# Patient Record
Sex: Female | Born: 1990 | Race: Black or African American | Hispanic: No | Marital: Single | Smoking: Never smoker
Health system: Southern US, Community
[De-identification: ages and names within clinical notes are randomized; demographics above are authoritative.]

---
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
## 2009-01-01 ENCOUNTER — Encounter: Payer: Self-pay | Admitting: *Deleted

## 2012-09-21 ENCOUNTER — Emergency Department (HOSPITAL_COMMUNITY)
Admission: EM | Admit: 2012-09-21 | Discharge: 2012-09-21 | Disposition: A | Discharge status: Home / Self Care | Payer: BC Managed Care – PPO | Attending: Emergency Medicine | Admitting: Emergency Medicine

## 2012-09-21 ENCOUNTER — Encounter (HOSPITAL_COMMUNITY): Payer: Self-pay | Admitting: Emergency Medicine

## 2012-09-21 DIAGNOSIS — M542 Cervicalgia: Secondary | ICD-10-CM | POA: Insufficient documentation

## 2012-09-21 DIAGNOSIS — M2420 Disorder of ligament, unspecified site: Secondary | ICD-10-CM | POA: Insufficient documentation

## 2012-09-21 DIAGNOSIS — IMO0002 Reserved for concepts with insufficient information to code with codable children: Secondary | ICD-10-CM | POA: Insufficient documentation

## 2012-09-21 DIAGNOSIS — M629 Disorder of muscle, unspecified: Secondary | ICD-10-CM | POA: Insufficient documentation

## 2012-09-21 DIAGNOSIS — M62838 Other muscle spasm: Secondary | ICD-10-CM | POA: Insufficient documentation

## 2012-09-21 DIAGNOSIS — Z79899 Other long term (current) drug therapy: Secondary | ICD-10-CM | POA: Insufficient documentation

## 2012-09-21 DIAGNOSIS — R252 Cramp and spasm: Secondary | ICD-10-CM

## 2012-09-21 MED ORDER — METHOCARBAMOL 500 MG PO TABS: 500.0000 mg | ORAL_TABLET | Freq: Two times a day (BID) | ORAL | Status: DC

## 2012-09-21 MED ORDER — IBUPROFEN 800 MG PO TABS: 800.0000 mg | ORAL_TABLET | Freq: Three times a day (TID) | ORAL | Status: DC

## 2012-09-21 MED ORDER — KETOROLAC TROMETHAMINE 60 MG/2ML IM SOLN
60.0000 mg | Freq: Once | INTRAMUSCULAR | Status: AC
  Administered 2012-09-21: 60 mg via INTRAMUSCULAR
  Filled 2012-09-21: qty 2

## 2012-09-21 NOTE — ED Provider Notes (Signed)
CSN: [id number]     Arrival date & time 09/21/12  2221 History     First MD Initiated Contact with Patient 09/21/12 2240     Chief Complaint  Patient presents with  . Headache  . Neck Pain   (Consider location/radiation/quality/duration/timing/severity/associated sxs/prior Treatment) HPI Comments: Patient presents emergency department with chief complaint of neck pain. She states that she works as a Production assistant, radio at [employer], and thinks that she may have strained her neck while at work. She states that the left side of her neck feels tight. She reports associated headache. She denies fevers, chills, nausea, or vomiting. She states that she has been taking Goody powders and using heat on the affected area with some relief. She denies any other mechanism of injury. She is otherwise healthy. No other complaints  The history is provided by the patient. No language interpreter was used.    History reviewed. No pertinent past medical history. History reviewed. No pertinent past surgical history. No family history on file. History  Substance Use Topics  . Smoking status: Never Smoker   . Smokeless tobacco: Never Used  . Alcohol Use: Yes     Comment: social   OB History   Grav Para Term Preterm Abortions TAB SAB Ect Mult Living                 Review of Systems  All other systems reviewed and are negative.    Allergies  Review of patient's allergies indicates no known allergies.  Home Medications   Current Outpatient Rx  Name  Route  Sig  Dispense  Refill  . Aspirin-Acetaminophen-Caffeine (GOODY HEADACHE PO)   Oral   Take 1 packet by mouth daily as needed (HA pain).         . fexofenadine (ALLEGRA) 180 MG tablet      1 pill once a day as needed for allergies          . fluticasone (FLONASE) 50 MCG/ACT nasal spray   Nasal   2 sprays by Nasal route daily.           . norgestimate-ethinyl estradiol (SPRINTEC 28) 0.25-35 MG-MCG per tablet      1 pill by mouth daily as  directed. Dispense quantity sufficient for 1 month          . ibuprofen (ADVIL,MOTRIN) 800 MG tablet   Oral   Take 1 tablet (800 mg total) by mouth 3 (three) times daily.   21 tablet   0   . methocarbamol (ROBAXIN) 500 MG tablet   Oral   Take 1 tablet (500 mg total) by mouth 2 (two) times daily.   20 tablet   0    BP 142/84  Pulse 105  Temp(Src) 98.7 F (37.1 C) (Oral)  Resp 16  Ht 5\' 7"  (1.702 m)  Wt 193 lb (87.544 kg)  BMI 30.22 kg/m2  SpO2 95%  LMP 09/12/2012 Physical Exam  Nursing note and vitals reviewed. Constitutional: She is oriented to person, place, and time. She appears well-developed and well-nourished. No distress.  HENT:  Head: Normocephalic and atraumatic.  Eyes: Conjunctivae and EOM are normal. Right eye exhibits no discharge. Left eye exhibits no discharge. No scleral icterus.  Neck: Normal range of motion. Neck supple. No tracheal deviation present.  No meningeal signs  Cardiovascular: Normal rate, regular rhythm and normal heart sounds.  Exam reveals no gallop and no friction rub.   No murmur heard. Pulmonary/Chest: Effort normal and breath sounds normal.  No respiratory distress. She has no wheezes.  Abdominal: Soft. She exhibits no distension. There is no tenderness.  Musculoskeletal: Normal range of motion.  Left sided cervical paraspinal and upper trapezius muscles tender to palpation, no bony tenderness, step-offs, or gross abnormality or deformity of spine, patient is able to ambulate, moves all extremities  Neurological: She is alert and oriented to person, place, and time.  Sensation and strength intact bilaterally  Skin: Skin is warm. She is not diaphoretic.  Psychiatric: She has a normal mood and affect. Her behavior is normal. Judgment and thought content normal.    ED Course   Procedures (including critical care time)  Labs Reviewed - No data to display No results found. 1. Muscle cramp     MDM  Patient with muscle cramps of the  upper trapezius on left side. Likely causing tension headache. Will give portal shot, and will discharge to home with ibuprofen and instructions to use ice on the affected area. Patient denies fevers, she is not having meningeal signs. She is well-appearing and is stable and ready for discharge.  Roxy Horseman, PA-C 09/21/12 2356

## 2012-09-21 NOTE — ED Notes (Signed)
Per pt, she was having a bad HA Monday night. On Tuesday, the pain radiated down to her neck and to her left shoulder. Pt states she has been taking goody powder and applying heat to the area. 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

## 2012-09-22 NOTE — ED Provider Notes (Signed)
Medical screening examination/treatment/procedure(s) were performed by non-physician practitioner and as supervising physician I was immediately available for consultation/collaboration.  Flint Melter, MD 09/22/12 [phone]

## 2013-01-04 ENCOUNTER — Emergency Department (HOSPITAL_COMMUNITY): Payer: BC Managed Care – PPO

## 2013-01-04 ENCOUNTER — Emergency Department (HOSPITAL_COMMUNITY)
Admission: EM | Admit: 2013-01-04 | Discharge: 2013-01-05 | Disposition: A | Discharge status: Home / Self Care | Payer: BC Managed Care – PPO | Attending: Emergency Medicine | Admitting: Emergency Medicine

## 2013-01-04 DIAGNOSIS — S161XXA Strain of muscle, fascia and tendon at neck level, initial encounter: Secondary | ICD-10-CM

## 2013-01-04 DIAGNOSIS — IMO0002 Reserved for concepts with insufficient information to code with codable children: Secondary | ICD-10-CM | POA: Insufficient documentation

## 2013-01-04 DIAGNOSIS — Y9389 Activity, other specified: Secondary | ICD-10-CM | POA: Insufficient documentation

## 2013-01-04 DIAGNOSIS — Z79899 Other long term (current) drug therapy: Secondary | ICD-10-CM | POA: Insufficient documentation

## 2013-01-04 DIAGNOSIS — Z791 Long term (current) use of non-steroidal anti-inflammatories (NSAID): Secondary | ICD-10-CM | POA: Insufficient documentation

## 2013-01-04 DIAGNOSIS — S4980XA Other specified injuries of shoulder and upper arm, unspecified arm, initial encounter: Secondary | ICD-10-CM | POA: Insufficient documentation

## 2013-01-04 DIAGNOSIS — S46909A Unspecified injury of unspecified muscle, fascia and tendon at shoulder and upper arm level, unspecified arm, initial encounter: Secondary | ICD-10-CM | POA: Insufficient documentation

## 2013-01-04 DIAGNOSIS — Y92410 Unspecified street and highway as the place of occurrence of the external cause: Secondary | ICD-10-CM | POA: Insufficient documentation

## 2013-01-04 DIAGNOSIS — S139XXA Sprain of joints and ligaments of unspecified parts of neck, initial encounter: Secondary | ICD-10-CM | POA: Insufficient documentation

## 2013-01-04 MED ORDER — CYCLOBENZAPRINE HCL 10 MG PO TABS
10.0000 mg | ORAL_TABLET | Freq: Once | ORAL | Status: AC
  Administered 2013-01-04: 10 mg via ORAL
  Filled 2013-01-04: qty 1

## 2013-01-04 MED ORDER — IBUPROFEN 800 MG PO TABS
800.0000 mg | ORAL_TABLET | Freq: Once | ORAL | Status: AC
  Administered 2013-01-04: 800 mg via ORAL
  Filled 2013-01-04: qty 1
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
## 2013-01-05 MED ORDER — IBUPROFEN 800 MG PO TABS: 800.0000 mg | ORAL_TABLET | Freq: Four times a day (QID) | ORAL | Status: DC | PRN

## 2013-01-05 MED ORDER — CYCLOBENZAPRINE HCL 10 MG PO TABS: 10.0000 mg | ORAL_TABLET | Freq: Three times a day (TID) | ORAL | Status: DC | PRN

## 2013-01-05 NOTE — ED Provider Notes (Signed)
Medical screening examination/treatment/procedure(s) were performed by non-physician practitioner and as supervising physician I was immediately available for consultation/collaboration.  EKG Interpretation   None        Tynika Luddy, MD 01/05/13 0438 

## 2015-03-22 ENCOUNTER — Encounter (HOSPITAL_COMMUNITY): Payer: Self-pay | Admitting: *Deleted

## 2015-03-22 ENCOUNTER — Emergency Department (INDEPENDENT_AMBULATORY_CARE_PROVIDER_SITE_OTHER): Payer: Self-pay

## 2015-03-22 ENCOUNTER — Emergency Department (INDEPENDENT_AMBULATORY_CARE_PROVIDER_SITE_OTHER)
Admission: EM | Admit: 2015-03-22 | Discharge: 2015-03-22 | Disposition: A | Discharge status: Home / Self Care | Payer: Self-pay | Source: Home / Self Care | Attending: Family Medicine | Admitting: Family Medicine

## 2015-03-22 DIAGNOSIS — S161XXA Strain of muscle, fascia and tendon at neck level, initial encounter: Secondary | ICD-10-CM

## 2015-03-22 MED ORDER — DICLOFENAC POTASSIUM 50 MG PO TABS
50.0000 mg | ORAL_TABLET | Freq: Three times a day (TID) | ORAL | Status: AC
Start: 2015-03-22 — End: ?

## 2015-03-22 MED ORDER — CYCLOBENZAPRINE HCL 5 MG PO TABS: 5.0000 mg | ORAL_TABLET | Freq: Three times a day (TID) | ORAL | Status: AC | PRN

## 2015-03-22 NOTE — ED Notes (Signed)
Pt  Reports    Headache   With     Pain   In  Neck   And  l  Shoulder       With   Symptoms  X  2  Weeks           Pt  Reports   Pain not  releived  By OTC     MEDS        Pt   Reports     History  Of  Allergies         Seasonal /  Pollen           Pt  Also  Reports  Some  History     Of          Headaches   In  Past

## 2015-03-22 NOTE — ED Provider Notes (Signed)
CSN: [id number]     Arrival date & time 03/22/15  1313 History   First MD Initiated Contact with Patient 03/22/15 1450     Chief Complaint  Patient presents with  . Headache   (Consider location/radiation/quality/duration/timing/severity/associated sxs/prior Treatment) Patient is a 25 y.o. female presenting with headaches. The history is provided by the patient.  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 Relieved by:  None tried Worsened by:  Nothing Associated symptoms: neck pain   Associated symptoms: no congestion, no ear pain, no neck stiffness and no sore throat     History reviewed. No pertinent past medical history. History reviewed. No pertinent past surgical history. History reviewed. No pertinent family history. Social History  Substance Use Topics  . Smoking status: Never Smoker   . Smokeless tobacco: Never Used  . Alcohol Use: Yes     Comment: social   OB History    No data available     Review of Systems  Constitutional: Negative.   HENT: Negative for congestion, ear pain, rhinorrhea and sore throat.   Musculoskeletal: Positive for neck pain. Negative for neck stiffness.  Skin: Negative.   Neurological: Positive for headaches.  Hematological: Negative for adenopathy.  All other systems reviewed and are negative.   Allergies  Review of patient's allergies indicates no known allergies.  Home Medications   Prior to Admission medications   Medication Sig Start Date End Date Taking? Authorizing Provider  cyclobenzaprine (FLEXERIL) 5 MG tablet Take 1 tablet (5 mg total) by mouth 3 (three) times daily as needed for muscle spasms. 03/22/15   Linna Hoff, MD  diclofenac (CATAFLAM) 50 MG tablet Take 1 tablet (50 mg total) by mouth 3 (three)  times daily. 03/22/15   Linna Hoff, MD  ibuprofen (ADVIL,MOTRIN) 800 MG tablet Take 1 tablet (800 mg total) by mouth every 6 (six) hours as needed for moderate pain. 01/05/13   Cherrie Distance, PA-C   Meds Ordered and Administered this Visit  Medications - No data to display  BP 126/82 mmHg  Pulse 83  Temp(Src) 98.9 F (37.2 C) (Oral)  SpO2 98%  LMP 02/18/2015 No data found.   Physical Exam  Constitutional: She is oriented to person, place, and time. She appears well-developed and well-nourished. No distress.  HENT:  Right Ear: External ear normal.  Left Ear: External ear normal.  Mouth/Throat: Oropharynx is clear and moist.  Eyes: Pupils are equal, round, and reactive to light.  Neck: Normal range of motion. Neck supple.  Musculoskeletal: Normal range of motion. She exhibits tenderness.       Back:  Lymphadenopathy:    She has no cervical adenopathy.  Neurological: She is alert and oriented to person, place, and time.  Skin: Skin is warm and dry.  Nursing note and vitals reviewed.   ED Course  Procedures (including critical care time)  Labs Review Labs Reviewed - No data to display  Imaging Review Dg Cervical Spine Complete  03/22/2015  CLINICAL DATA:  Headache and neck pain for 2 weeks. No known injury. Initial encounter. EXAM: CERVICAL SPINE - COMPLETE 4+ VIEW COMPARISON:  Plain film cervical spine 01/04/2013. FINDINGS: There is no evidence of cervical spine fracture or prevertebral soft tissue swelling. Alignment is normal. No other significant  bone abnormalities are identified. IMPRESSION: Negative cervical spine radiographs. Electronically Signed   By: Drusilla Kanner M.D.   On: 03/22/2015 15:26   X-rays reviewed and report per radiologist.   Visual Acuity Review  Right Eye Distance:   Left Eye Distance:   Bilateral Distance:    Right Eye Near:   Left Eye Near:    Bilateral Near:         MDM   1. Cervical myofascial strain, initial encounter         Linna Hoff, MD 03/22/15 [phone]

## 2017-02-15 IMAGING — DX DG CERVICAL SPINE COMPLETE 4+V
6 series · 6 of 6 positions shown · non-contrast
Comparison: Plain film cervical spine 01/04/2013.

CLINICAL DATA: Headache and neck pain for 2 weeks. No known injury.
Initial encounter.

EXAM:
CERVICAL SPINE - COMPLETE 4+ VIEW

[c-spine lat]
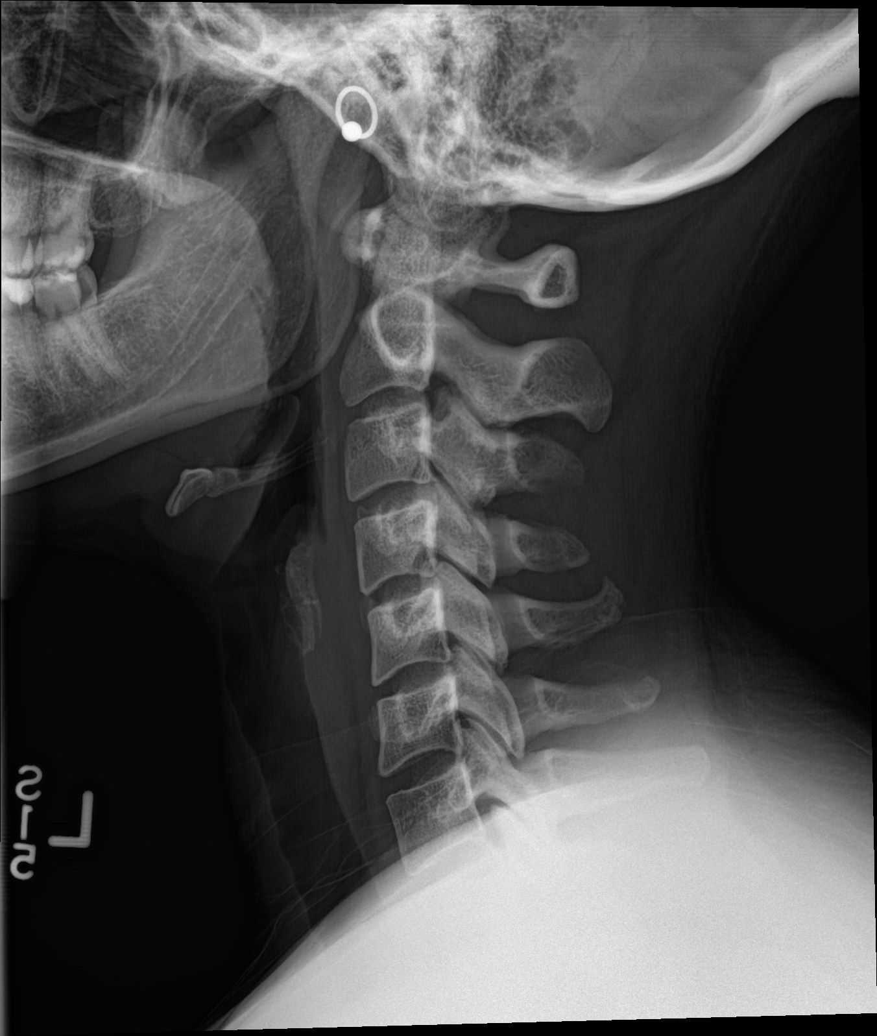

[c-spine obl (1 of 2)]
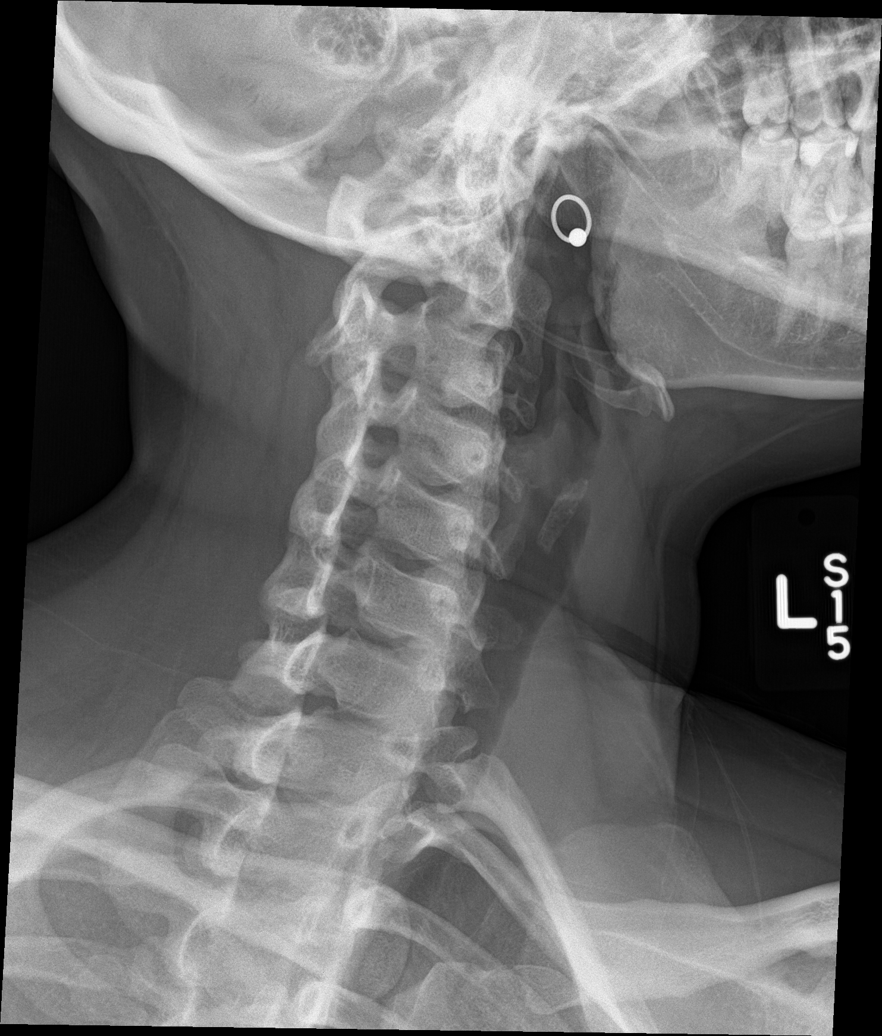

[c-spine obl (2 of 2)]
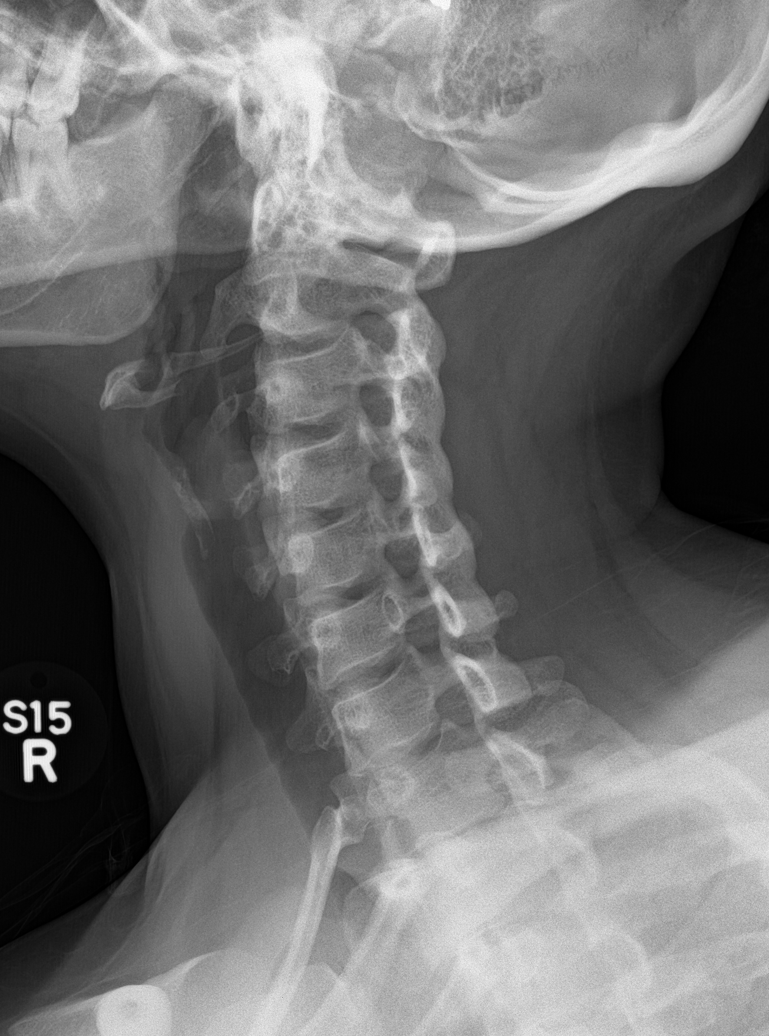

[c-spine ap]
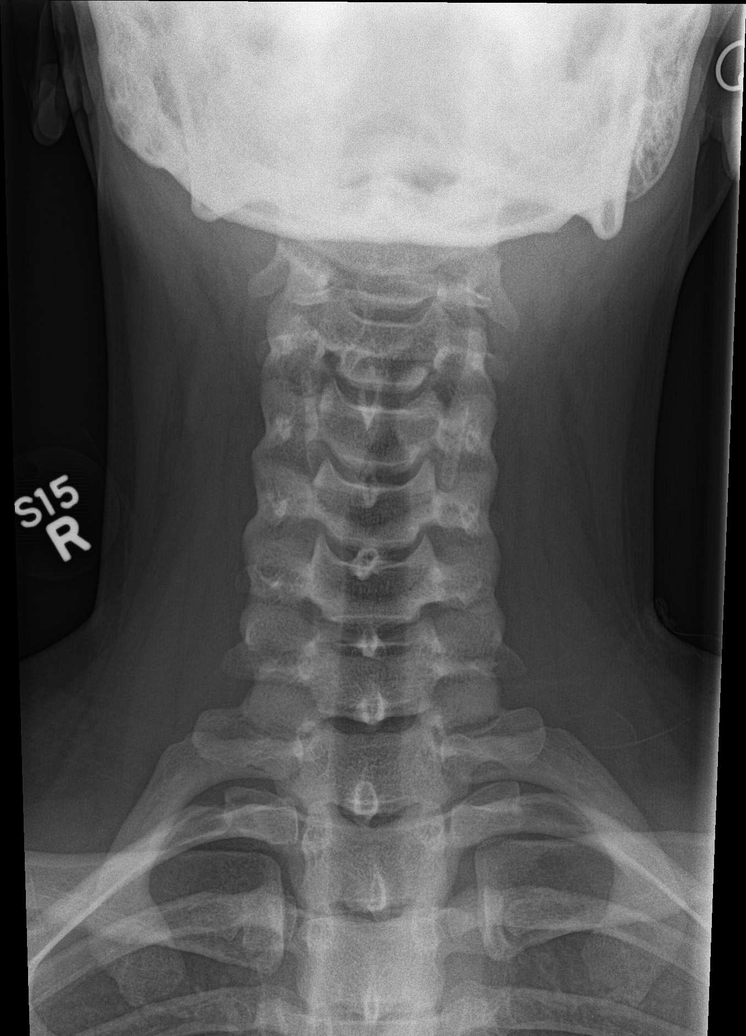

[c-spine open mouth]
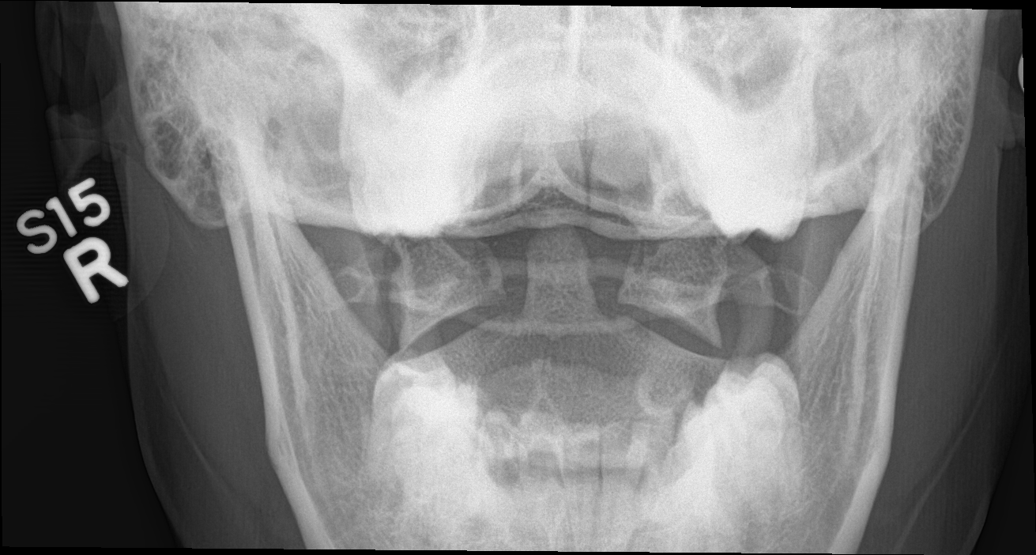

[t-spine swimmers]
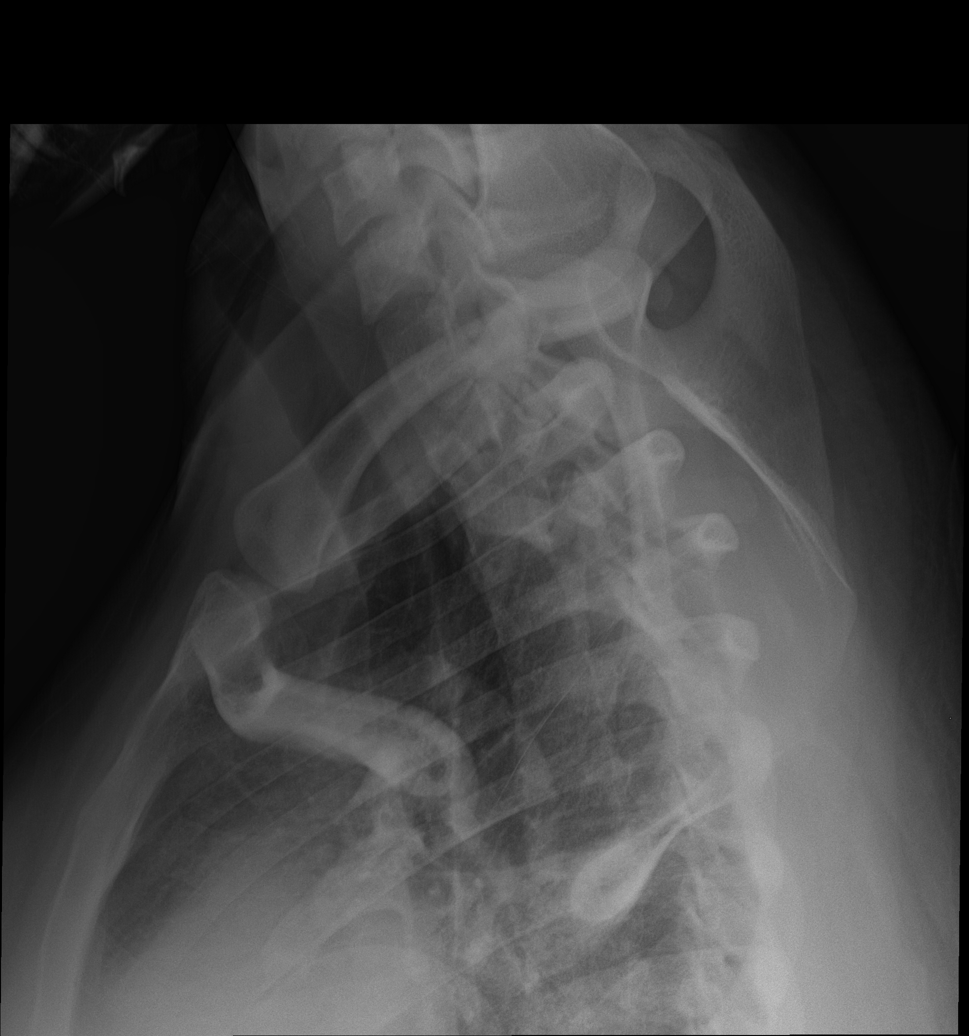

[6 of 6 positions shown; findings below may reference images not displayed]

FINDINGS: There is no evidence of cervical spine fracture or prevertebral soft
tissue swelling. Alignment is normal. No other significant bone
abnormalities are identified.
IMPRESSION: Negative cervical spine radiographs.

## 2017-07-20 ENCOUNTER — Emergency Department (HOSPITAL_COMMUNITY)
Admission: EM | Admit: 2017-07-20 | Discharge: 2017-07-20 | Disposition: A | Discharge status: Home / Self Care | Attending: Emergency Medicine | Admitting: Emergency Medicine

## 2017-07-20 ENCOUNTER — Other Ambulatory Visit: Payer: Self-pay

## 2017-07-20 ENCOUNTER — Encounter (HOSPITAL_COMMUNITY): Payer: Self-pay | Admitting: Emergency Medicine

## 2017-07-20 ENCOUNTER — Emergency Department (HOSPITAL_COMMUNITY)

## 2017-07-20 DIAGNOSIS — S0990XA Unspecified injury of head, initial encounter: Secondary | ICD-10-CM | POA: Diagnosis present

## 2017-07-20 DIAGNOSIS — S0083XA Contusion of other part of head, initial encounter: Secondary | ICD-10-CM

## 2017-07-20 DIAGNOSIS — W228XXA Striking against or struck by other objects, initial encounter: Secondary | ICD-10-CM | POA: Insufficient documentation

## 2017-07-20 DIAGNOSIS — Y9259 Other trade areas as the place of occurrence of the external cause: Secondary | ICD-10-CM | POA: Insufficient documentation

## 2017-07-20 DIAGNOSIS — Y9389 Activity, other specified: Secondary | ICD-10-CM | POA: Diagnosis not present

## 2017-07-20 DIAGNOSIS — M542 Cervicalgia: Secondary | ICD-10-CM | POA: Insufficient documentation

## 2017-07-20 DIAGNOSIS — Y990 Civilian activity done for income or pay: Secondary | ICD-10-CM | POA: Insufficient documentation

## 2017-07-20 LAB — I-STAT BETA HCG BLOOD, ED (MC, WL, AP ONLY)

## 2017-07-20 MED ORDER — IBUPROFEN 800 MG PO TABS: 800.0000 mg | ORAL_TABLET | Freq: Three times a day (TID) | ORAL | 0 refills | Status: AC | PRN

## 2017-07-20 NOTE — Discharge Instructions (Addendum)
Take Tylenol or Motrin for pain and follow-up with your family doctor if any problems ?
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
## 2017-07-20 NOTE — ED Provider Notes (Signed)
[facility]-EMERGENCY DEPT Provider Note   CSN: [id number] Arrival date & time: 07/20/17  1903     History   Chief Complaint Chief Complaint  Patient presents with  . Headache    r/o fall metal object at work  . Neck Pain    HPI Yvonne Ross is a 27 y.o. female.  Patient states she was at work and a metal object fell and hit her in the face no loss of consciousness.  The history is provided by the patient. No language interpreter was used.  Headache   This is a new problem. The current episode started 3 to 5 hours ago. The problem occurs constantly. The problem has not changed since onset.The headache is associated with nothing. The pain is located in the right unilateral region. The quality of the pain is described as dull. The pain is mild. The pain does not radiate. She has tried nothing for the symptoms.  Neck Pain   Associated symptoms include headaches. Pertinent negatives include no chest pain.    History reviewed. No pertinent past medical history.  Patient Active Problem List   Diagnosis Date Noted  . DISORDER OF BONE AND CARTILAGE UNSPECIFIED 12/28/2007  . IRREGULAR MENSES 08/30/2007  . RHINITIS, ALLERGIC 04/15/2006  . HEADACHE, UNSPECIFIED 04/15/2006    History reviewed. No pertinent surgical history.   OB History    Gravida  0   Para  0   Term  0   Preterm  0   AB  0   Living  0     SAB  0   TAB  0   Ectopic  0   Multiple  0   Live Births  0            Home Medications    Prior to Admission medications   Medication Sig Start Date End Date Taking? Authorizing Provider  cyclobenzaprine (FLEXERIL) 5 MG tablet Take 1 tablet (5 mg total) by mouth 3 (three) times daily as needed for muscle spasms. Patient not taking: Reported on 07/20/2017 03/22/15   Linna Hoff, MD  diclofenac (CATAFLAM) 50 MG tablet Take 1 tablet (50 mg total) by mouth 3 (three) times daily. Patient not taking: Reported on 07/20/2017 03/22/15    Linna Hoff, MD  ibuprofen (ADVIL,MOTRIN) 800 MG tablet Take 1 tablet (800 mg total) by mouth every 8 (eight) hours as needed. 07/20/17   Bethann Berkshire, MD    Family History History reviewed. No pertinent family history.  Social History Social History   Tobacco Use  . Smoking status: Never Smoker  . Smokeless tobacco: Never Used  Substance Use Topics  . Alcohol use: Yes    Comment: social  . Drug use: No     Allergies   Patient has no known allergies.   Review of Systems Review of Systems  Constitutional: Negative for appetite change and fatigue.  HENT: Negative for congestion, ear discharge and sinus pressure.   Eyes: Negative for discharge.  Respiratory: Negative for cough.   Cardiovascular: Negative for chest pain.  Gastrointestinal: Negative for abdominal pain and diarrhea.  Genitourinary: Negative for frequency and hematuria.  Musculoskeletal: Positive for neck pain. Negative for back pain.  Skin: Negative for rash.  Neurological: Positive for headaches. Negative for seizures.  Psychiatric/Behavioral: Negative for hallucinations.     Physical Exam Updated Vital Signs BP (!) 153/59 (BP Location: Left Arm)   Pulse 84   Temp 99.3 F (37.4 C) (Oral)  Resp 18   Ht 5\' 7"  (1.702 m)   Wt 103 kg (227 lb)   LMP 07/07/2017 (Approximate)   SpO2 100%   BMI 35.55 kg/m   Physical Exam  Constitutional: She is oriented to person, place, and time. She appears well-developed.  HENT:  Head: Normocephalic.  Bruising around the right eye.  Mild tenderness posterior neck  Eyes: Conjunctivae and EOM are normal. No scleral icterus.  Neck: Neck supple. No thyromegaly present.  Cardiovascular: Normal rate and regular rhythm. Exam reveals no gallop and no friction rub.  No murmur heard. Pulmonary/Chest: No stridor. She has no wheezes. She has no rales. She exhibits no tenderness.  Abdominal: She exhibits no distension. There is no tenderness. There is no rebound.    Musculoskeletal: Normal range of motion. She exhibits no edema.  Lymphadenopathy:    She has no cervical adenopathy.  Neurological: She is oriented to person, place, and time. She exhibits normal muscle tone. Coordination normal.  Skin: No rash noted. No erythema.  Psychiatric: She has a normal mood and affect. Her behavior is normal.     ED Treatments / Results  Labs (all labs ordered are listed, but only abnormal results are displayed) Labs Reviewed  I-STAT BETA HCG BLOOD, ED ([facility], [facility], [facility] ONLY)    EKG None  Radiology Ct Head Wo Contrast  Result Date: 07/20/2017 CLINICAL DATA:  Headache, neck pain, and right-sided facial pain after being hit in the right side of the head by a falling metal shelf. EXAM: CT HEAD WITHOUT CONTRAST CT MAXILLOFACIAL WITHOUT CONTRAST CT CERVICAL SPINE WITHOUT CONTRAST TECHNIQUE: Multidetector CT imaging of the head, cervical spine, and maxillofacial structures were performed using the standard protocol without intravenous contrast. Multiplanar CT image reconstructions of the cervical spine and maxillofacial structures were also generated. COMPARISON:  Cervical spine x-rays dated March 22, 2015. FINDINGS: CT HEAD FINDINGS Brain: No evidence of acute infarction, hemorrhage, hydrocephalus, extra-axial collection or mass lesion/mass effect. Vascular: No hyperdense vessel or unexpected calcification. Skull: Normal. Negative for fracture or focal lesion. Other: None. CT MAXILLOFACIAL FINDINGS Osseous: No fracture or mandibular dislocation. No destructive process. Orbits: Negative. No traumatic or inflammatory finding. Sinuses: Clear. Soft tissues: Negative. CT CERVICAL SPINE FINDINGS Alignment: Slight reversal of the normal cervical lordosis. No traumatic malalignment. Skull base and vertebrae: No acute fracture. No primary bone lesion or focal pathologic process. Soft tissues and spinal canal: No prevertebral fluid or swelling. No visible canal hematoma. Disc levels:   Normal. Upper chest: Negative. Other: None. IMPRESSION: 1.  No acute intracranial abnormality. 2.  No acute facial fracture. 3.  No acute cervical spine fracture. Electronically Signed   By: Obie Dredge M.D.   On: 07/20/2017 22:27   Ct Cervical Spine Wo Contrast  Result Date: 07/20/2017 CLINICAL DATA:  Headache, neck pain, and right-sided facial pain after being hit in the right side of the head by a falling metal shelf. EXAM: CT HEAD WITHOUT CONTRAST CT MAXILLOFACIAL WITHOUT CONTRAST CT CERVICAL SPINE WITHOUT CONTRAST TECHNIQUE: Multidetector CT imaging of the head, cervical spine, and maxillofacial structures were performed using the standard protocol without intravenous contrast. Multiplanar CT image reconstructions of the cervical spine and maxillofacial structures were also generated. COMPARISON:  Cervical spine x-rays dated March 22, 2015. FINDINGS: CT HEAD FINDINGS Brain: No evidence of acute infarction, hemorrhage, hydrocephalus, extra-axial collection or mass lesion/mass effect. Vascular: No hyperdense vessel or unexpected calcification. Skull: Normal. Negative for fracture or focal lesion. Other: None. CT MAXILLOFACIAL FINDINGS Osseous: No fracture  or mandibular dislocation. No destructive process. Orbits: Negative. No traumatic or inflammatory finding. Sinuses: Clear. Soft tissues: Negative. CT CERVICAL SPINE FINDINGS Alignment: Slight reversal of the normal cervical lordosis. No traumatic malalignment. Skull base and vertebrae: No acute fracture. No primary bone lesion or focal pathologic process. Soft tissues and spinal canal: No prevertebral fluid or swelling. No visible canal hematoma. Disc levels:  Normal. Upper chest: Negative. Other: None. IMPRESSION: 1.  No acute intracranial abnormality. 2.  No acute facial fracture. 3.  No acute cervical spine fracture. Electronically Signed   By: Obie DredgeWilliam T Derry M.D.   On: 07/20/2017 22:27   Ct Maxillofacial Wo Contrast  Result Date:  07/20/2017 CLINICAL DATA:  Headache, neck pain, and right-sided facial pain after being hit in the right side of the head by a falling metal shelf. EXAM: CT HEAD WITHOUT CONTRAST CT MAXILLOFACIAL WITHOUT CONTRAST CT CERVICAL SPINE WITHOUT CONTRAST TECHNIQUE: Multidetector CT imaging of the head, cervical spine, and maxillofacial structures were performed using the standard protocol without intravenous contrast. Multiplanar CT image reconstructions of the cervical spine and maxillofacial structures were also generated. COMPARISON:  Cervical spine x-rays dated March 22, 2015. FINDINGS: CT HEAD FINDINGS Brain: No evidence of acute infarction, hemorrhage, hydrocephalus, extra-axial collection or mass lesion/mass effect. Vascular: No hyperdense vessel or unexpected calcification. Skull: Normal. Negative for fracture or focal lesion. Other: None. CT MAXILLOFACIAL FINDINGS Osseous: No fracture or mandibular dislocation. No destructive process. Orbits: Negative. No traumatic or inflammatory finding. Sinuses: Clear. Soft tissues: Negative. CT CERVICAL SPINE FINDINGS Alignment: Slight reversal of the normal cervical lordosis. No traumatic malalignment. Skull base and vertebrae: No acute fracture. No primary bone lesion or focal pathologic process. Soft tissues and spinal canal: No prevertebral fluid or swelling. No visible canal hematoma. Disc levels:  Normal. Upper chest: Negative. Other: None. IMPRESSION: 1.  No acute intracranial abnormality. 2.  No acute facial fracture. 3.  No acute cervical spine fracture. Electronically Signed   By: Obie DredgeWilliam T Derry M.D.   On: 07/20/2017 22:27    Procedures Procedures (including critical care time)  Medications Ordered in ED Medications - No data to display   Initial Impression / Assessment and Plan / ED Course  I have reviewed the triage vital signs and the nursing notes.  Pertinent labs & imaging results that were available during my care of the patient were reviewed by  me and considered in my medical decision making (see chart for details).     CT scan head face and neck are all negative.  Patient has contusion to right forehead and right orbit.  She is prescribed Motrin and will follow-up as needed  Final Clinical Impressions(s) / ED Diagnoses   Final diagnoses:  Facial contusion, initial encounter    ED Discharge Orders        Ordered    ibuprofen (ADVIL,MOTRIN) 800 MG tablet  Every 8 hours PRN     07/20/17 2301       Bethann BerkshireZammit, Leanore Biggers, MD 07/20/17 2304
